# Patient Record
Sex: Female | Born: 1950
Health system: Southern US, Community
[De-identification: ages and names within clinical notes are randomized; demographics above are authoritative.]

## PROBLEM LIST (undated history)

## (undated) DIAGNOSIS — G4730 Sleep apnea, unspecified: Secondary | ICD-10-CM

## (undated) DIAGNOSIS — I10 Essential (primary) hypertension: Secondary | ICD-10-CM

## (undated) HISTORY — PX: KNEE SURGERY: SHX244

---

## 2001-04-02 ENCOUNTER — Ambulatory Visit (HOSPITAL_COMMUNITY): Admission: RE | Admit: 2001-04-02 | Discharge: 2001-04-02 | Payer: Self-pay | Admitting: *Deleted

## 2001-04-02 ENCOUNTER — Encounter: Payer: Self-pay | Admitting: Family Medicine

## 2006-04-19 ENCOUNTER — Encounter: Payer: Self-pay | Admitting: Family Medicine

## 2007-12-29 ENCOUNTER — Ambulatory Visit (HOSPITAL_COMMUNITY): Admission: RE | Admit: 2007-12-29 | Discharge: 2007-12-29 | Payer: Self-pay | Admitting: Family Medicine

## 2009-01-03 ENCOUNTER — Ambulatory Visit (HOSPITAL_COMMUNITY): Admission: RE | Admit: 2009-01-03 | Discharge: 2009-01-03 | Payer: Self-pay | Admitting: *Deleted

## 2009-02-22 ENCOUNTER — Encounter: Payer: Self-pay | Admitting: Family Medicine

## 2009-02-22 LAB — CONVERTED CEMR LAB
BUN: 20 mg/dL
CO2: 28 meq/L
Calcium: 9.9 mg/dL
Chloride: 106 meq/L
Creatinine, Ser: 1.5 mg/dL
Glucose, Bld: 113 mg/dL
Potassium: 4.3 meq/L
Sodium: 139 meq/L

## 2009-09-15 ENCOUNTER — Encounter: Payer: Self-pay | Admitting: Family Medicine

## 2009-09-15 ENCOUNTER — Ambulatory Visit: Payer: Self-pay | Admitting: Family Medicine

## 2009-09-15 DIAGNOSIS — M76899 Other specified enthesopathies of unspecified lower limb, excluding foot: Secondary | ICD-10-CM | POA: Insufficient documentation

## 2009-09-15 DIAGNOSIS — IMO0002 Reserved for concepts with insufficient information to code with codable children: Secondary | ICD-10-CM | POA: Insufficient documentation

## 2009-09-15 DIAGNOSIS — I10 Essential (primary) hypertension: Secondary | ICD-10-CM | POA: Insufficient documentation

## 2009-09-15 DIAGNOSIS — M1710 Unilateral primary osteoarthritis, unspecified knee: Secondary | ICD-10-CM

## 2009-09-15 DIAGNOSIS — E669 Obesity, unspecified: Secondary | ICD-10-CM | POA: Insufficient documentation

## 2009-09-16 ENCOUNTER — Telehealth: Payer: Self-pay | Admitting: Family Medicine

## 2009-09-16 LAB — CONVERTED CEMR LAB
BUN: 30 mg/dL — ABNORMAL HIGH (ref 6–23)
CO2: 21 meq/L (ref 19–32)
Calcium: 9.7 mg/dL (ref 8.4–10.5)
Chloride: 104 meq/L (ref 96–112)
Creatinine, Ser: 1.78 mg/dL — ABNORMAL HIGH (ref 0.40–1.20)
Glucose, Bld: 105 mg/dL — ABNORMAL HIGH (ref 70–99)
Potassium: 3.7 meq/L (ref 3.5–5.3)
Sodium: 138 meq/L (ref 135–145)

## 2009-09-20 ENCOUNTER — Telehealth: Payer: Self-pay | Admitting: Family Medicine

## 2009-09-22 ENCOUNTER — Encounter: Payer: Self-pay | Admitting: Family Medicine

## 2009-09-22 ENCOUNTER — Telehealth: Payer: Self-pay | Admitting: Family Medicine

## 2009-09-22 LAB — CONVERTED CEMR LAB
HCT: 36 %
MCV: 82.6 fL
WBC: 8.5 10*3/uL

## 2009-09-23 ENCOUNTER — Telehealth: Payer: Self-pay | Admitting: Family Medicine

## 2009-09-26 ENCOUNTER — Encounter: Payer: Self-pay | Admitting: Family Medicine

## 2009-09-28 ENCOUNTER — Ambulatory Visit: Payer: Self-pay | Admitting: Family Medicine

## 2009-09-28 DIAGNOSIS — N183 Chronic kidney disease, stage 3 unspecified: Secondary | ICD-10-CM | POA: Insufficient documentation

## 2009-09-28 DIAGNOSIS — R1084 Generalized abdominal pain: Secondary | ICD-10-CM | POA: Insufficient documentation

## 2009-09-29 ENCOUNTER — Encounter: Payer: Self-pay | Admitting: Family Medicine

## 2009-09-30 ENCOUNTER — Telehealth: Payer: Self-pay | Admitting: Family Medicine

## 2009-10-02 LAB — CONVERTED CEMR LAB
ALT: 11 units/L (ref 0–35)
AST: 16 units/L (ref 0–37)
Albumin: 4 g/dL (ref 3.5–5.2)
Alkaline Phosphatase: 62 units/L (ref 39–117)
BUN: 12 mg/dL (ref 6–23)
CO2: 24 meq/L (ref 19–32)
Calcium: 9 mg/dL (ref 8.4–10.5)
Chloride: 107 meq/L (ref 96–112)
Creatinine, Ser: 1.23 mg/dL — ABNORMAL HIGH (ref 0.40–1.20)
Glucose, Bld: 91 mg/dL (ref 70–99)
Potassium: 3.6 meq/L (ref 3.5–5.3)
Sodium: 142 meq/L (ref 135–145)
Total Bilirubin: 0.8 mg/dL (ref 0.3–1.2)
Total Protein: 7.3 g/dL (ref 6.0–8.3)

## 2009-10-13 ENCOUNTER — Encounter: Payer: Self-pay | Admitting: Family Medicine

## 2009-12-14 ENCOUNTER — Encounter: Payer: Self-pay | Admitting: Family Medicine

## 2010-01-03 ENCOUNTER — Encounter: Payer: Self-pay | Admitting: Family Medicine

## 2010-01-04 ENCOUNTER — Encounter: Payer: Self-pay | Admitting: Family Medicine

## 2010-01-05 ENCOUNTER — Ambulatory Visit (HOSPITAL_COMMUNITY): Admission: RE | Admit: 2010-01-05 | Discharge: 2010-01-05 | Payer: Self-pay | Admitting: Family Medicine

## 2010-04-04 NOTE — Progress Notes (Signed)
  Phone Note Call from Patient   Caller: Patient Details for Reason: Abdominal Swelling Summary of Call: Pt called, has not felt well since taking one dose of Ultram last week, feels uneasy and not like herself. Yesterday began having abdominal discomoft and swelling, in abdomen has increased, unable to button pants. Pt advised to go to ER to be evaluated, no fever, N/V. But per pt very large abdomen looks like she is 6 months pregnant. Will be seen at ED in [facility]

## 2010-04-04 NOTE — Assessment & Plan Note (Signed)
Summary: f/u visit/bmc   Vital Signs:  Patient profile:   60 year old female Height:      64.75 inches Weight:      245 pounds BMI:     41.23 Temp:     97.6 degrees F oral Pulse rate:   64 / minute BP sitting:   154 / 90  (left arm) Cuff size:   large  Vitals Entered By: Tessie Fass CMA (September 28, 2009 10:59 AM) CC: F/U labs-HTN/stomach issues Is Patient Diabetic? No Pain Assessment Patient in pain? no        Primary Care Provider:  Milinda Antis MD  CC:  F/U labs-HTN/stomach issues.  History of Present Illness:    Pt new to practice see previous notes  HTN/Labs- Cr 1.7 on labs, stopped all meds except for Coreg which was increased to two times a day dosing, check labs today and plan to restart some of the other meds . BP has been elevated 150-170's  Stomach issues- Pt called the after hours last week- I happend to be MD on call, Pt took 1 ultram tablet then began feeling nauseas and bloating and fatigued. Overnight she had abdominal swelling therefore went to [facility] as advised. States labs and scan were normal. Since then she continue to have abdominal bloating on and off, unsure if any spefici foods are causing it except salad. No current pain but feels sore all over, BM irregular and small but no straining or blood. Nauesea has improved but still feels run down   Habits & Providers  Alcohol-Tobacco-Diet     Tobacco Status: never  Current Medications (verified): 1)  Coreg 25 Mg Tabs (Carvedilol) .Marland Kitchen.. 1 By Mouth Bid 2)  Diovan Hct 160-12.5 Mg Tabs (Valsartan-Hydrochlorothiazide) .Marland Kitchen.. 1 By Mouth Daily 3)  Pravachol 20 Mg Tabs (Pravastatin Sodium) .Marland Kitchen.. 1 By Mouth At Bedtime  Allergies (verified): 1)  ! Codeine Sulfate (Codeine Sulfate) 2)  ! Norvasc (Amlodipine Besylate) 3)  ! Lipitor (Atorvastatin Calcium) 4)  Ultram (Tramadol Hcl)  Review of Systems       The patient complains of abdominal pain.  The patient denies fever, weight loss, and severe  indigestion/heartburn.         per hpi  denies dysuria or vaginal discharge  Physical Exam  General:  pleasant, NAD, Vital signs noted obese Mouth:  mmm Abdomen:  soft, normal bowel sounds, no distention, no guarding, no rigidity, no rebound tenderness, and no abdominal hernia.  mild TTP on deep palpation, neg Murphy's sign   Impression & Recommendations:  Problem # 1:  ESSENTIAL HYPERTENSION (ICD-401.9) Assessment Deteriorated Will recheck labs, plan to restart the DIOVAN HCT, pt likley needs multiple agents based on history Lipids at next visit- hopefully pt will have her insurance Her updated medication list for this problem includes:    Coreg 25 Mg Tabs (Carvedilol) .Marland Kitchen... 1 by mouth bid    Diovan Hct 160-12.5 Mg Tabs (Valsartan-hydrochlorothiazide) .Marland Kitchen... 1 by mouth daily  Orders: [facility] ([address]454) [facility]- Est  Level 4 (09811)  Problem # 2:  RENAL INSUFFICIENCY, ACUTE (ICD-585.9) Assessment: New  Baseline approx 1.5 based on a single lab in pt records from previous MD, check labs today  Orders: [facility]- Est  Level 4 (91478)  Problem # 3:  ABDOMINAL PAIN, GENERALIZED (ICD-789.07) Assessment: New  unclear cause, pt seen in ED per report CT done and labs, will send for records. Exam reassuring today. Symptoms mostly of gas and bloating discomfort ? lactose intolerance  vs constipation,. Pt to record foods causing upset stomach, she ate an enture meal last night and was asking about french fries.  No other work-up at this time. CMET per above,, if persist based on review of previous imaging, +/- RUQ ultrasound to r/o gallbladder etiology  Orders: [facility]- Est  Level 4 ([address]295)  Complete Medication List: 1)  Coreg 25 Mg Tabs (Carvedilol) .Marland Kitchen.. 1 by mouth bid 2)  Diovan Hct 160-12.5 Mg Tabs (Valsartan-hydrochlorothiazide) .Marland Kitchen.. 1 by mouth daily 3)  Pravachol 20 Mg Tabs (Pravastatin sodium) .Marland Kitchen.. 1 by mouth at bedtime  Patient Instructions: 1)  For your stomach, Avoid dairy  foods, fried foods,  2)  Try to note any foods- that cause any symptoms 3)  For your blood pressure,  continue the Coreg 4)  I will draw your labs today and call you tomorow 5)  I will obtain your medical records from [facility] 6)  You can take Tylenol for any aches and pains 7)  Follow-up in 4 weeks    Prevention & Chronic Care Immunizations   Influenza vaccine: Not documented    Tetanus booster: Not documented    Pneumococcal vaccine: Not documented  Colorectal Screening   Hemoccult: Not documented    Colonoscopy: Not documented  Other Screening   Pap smear: Not documented    Mammogram: Not documented   Smoking status: never  (09/28/2009)  Lipids   Total Cholesterol: Not documented   LDL: Not documented   LDL Direct: Not documented   HDL: Not documented   Triglycerides: Not documented  Hypertension   Last Blood Pressure: 154 / 90  (09/28/2009)   Serum creatinine: 1.78  (09/15/2009)   Serum potassium 3.7  (09/15/2009) CMP ordered     Hypertension flowsheet reviewed?: Yes   Progress toward BP goal: Deteriorated  Self-Management Support :   Personal Goals (by the next clinic visit) :      Personal blood pressure goal: 140/90  (09/15/2009)   Hypertension self-management support: Not documented

## 2010-04-04 NOTE — Miscellaneous (Signed)
  Clinical Lists Changes  Problems: Changed problem from RENAL INSUFFICIENCY, ACUTE (ICD-585.9) to RENAL DISEASE, CHRONIC, STAGE III (ICD-585.3) Observations: Added new observation of PAST MED HX: Hypertension Baseline Cr 1.5 - Stage III CKD hyperlipidemia No history MI/CAD/CVA or renal disease OSA GERD LVH -- EKG 2008-2009 Menopause Arthritis History of torn ligament in Left Knee Osteoarthritis bilateral knees/ ?hips  Sleep Apnea- CPAP at bedtime  G3P3 Eye doctor- Dr. Reece [facility]- 04/19/06-EF 60%, mild LVH  h/o singer's nodes h/o largngitis with ulceration of right anterior true vocal cord (12/14/2009 23:27)      Past Medical History:    Hypertension    Baseline Cr 1.5 - Stage III CKD    hyperlipidemia    No history MI/CAD/CVA or renal disease    OSA    GERD    LVH -- EKG 2008-2009    Menopause    Arthritis    History of torn ligament in Left Knee    Osteoarthritis bilateral knees/ ?hips     Sleep Apnea- CPAP at bedtime     G3P3    Eye doctor- Dr. Re[facility]    echo- 04/19/06-EF 60%, mild LVH     h/o singer's nodes    h/o largngitis with ulceration of right anterior true vocal cord

## 2010-04-04 NOTE — Miscellaneous (Signed)
Summary: [facility] records- CT scan and lab results--  Clinical Lists Changes  Observations: Added new observation of PLATELETK/UL: 291 K/uL (09/22/2009 3:40) Added new observation of RDW: 13.6 % (09/22/2009 3:40) Added new observation of MCHC RBC: 26.1 g/dL (16/12/9602 5:40) Added new observation of MCV: 82.6 fL (09/22/2009 3:40) Added new observation of HCT: 36.0 % (09/22/2009 3:40) Added new observation of HGB: 11.4 g/dL (98/01/9146 8:29) Added new observation of WBC COUNT: 8.5 10*3/microliter (09/22/2009 3:40)      Reviewed [facility] ER notes Normal labs besides slightly low potassium CT scan for abdominal distension- No acute abdominal or pelvic process, slight increase in colonic wall thickening thought to be secondary to exaggerated decompressed state, noted small 5mm non specific RLQ lymph nodes, uterus enlarged with isodense 4cm mass likley fibroid CXR- small bilat effusiions, no effusions no infiltrate    f/u abd pain in 4 weeks at next visit, if still present- consider GU exam and ultrasound to f/u fibroids

## 2010-04-04 NOTE — Progress Notes (Signed)
  Phone Note Outgoing Call   Call placed by: Milinda Antis MD,  September 23, 2009 6:29 PM Call placed to: Patient Details for Reason: f/u ER visit Summary of Call:  Pt went to the [facility] for abdominal discomfort and swelling. CT scan and labs done, given IVF, UA everything was normal. Blood pressure was very elevated at ER- which was expected. Currently feeling better and now with minimal swelling. Will follow-up on Wed for repeat BMET and additional blood pressure meds

## 2010-04-04 NOTE — Assessment & Plan Note (Signed)
Summary: NP,tcb   Vital Signs:  Patient profile:   60 year old female Height:      64.75 inches Weight:      242 pounds BMI:     40.73 Temp:     97.8 degrees F oral Pulse rate:   70 / minute BP sitting:   125 / 88  (right arm) Cuff size:   large  Vitals Entered By: Tessie Fass CMA (September 15, 2009 9:51 AM) CC: NEW PT Is Patient Diabetic? No   CC:  NEW PT.  History of Present Illness:   60 y.o. female, complains today of bilat hip pain, Knee pain, HTN  Hip pain- for past 8 months, getting worse, described as sharp pain and aching pain on sides of hips, worse at night, pain with putting pressure on either side, no pain with ambulation or day to day activities  HTN- taking Triam/HCTZ 75/50, Diovan HCT 180/12.5, Coreg 25mg  daily (noted twice daily dosing on bottle), feels run down, does not take BP at home, unsure if she has had any problems with potassium in past  Knee pain- history of ligament injury after falling in [place] 15 years ago, seen by an orthopedist in past 5 years for severe pain, unsure of which doctor she saw or where, told her she would neeed a knee replacement. Aleve helps knees but not hip   Prevention- UTD on Mammogram                    Last PAP 2007- Dr. Victoriano Lain                    Colonoscopy 6 years ago, pt can not recall physician or where    Habits & Providers  Alcohol-Tobacco-Diet     Tobacco Status: never  Current Medications (verified): 1)  Coreg 25 Mg Tabs (Carvedilol) .Marland Kitchen.. 1 By Mouth Bid 2)  Diovan Hct 160-12.5 Mg Tabs (Valsartan-Hydrochlorothiazide) .Marland Kitchen.. 1 By Mouth Daily 3)  Pravachol 20 Mg Tabs (Pravastatin Sodium) .Marland Kitchen.. 1 By Mouth At Bedtime 4)  Ultram 50 Mg Tabs (Tramadol Hcl) .Marland Kitchen.. 1 By Mouth Three Times A Day As Needed Pain  Allergies (verified): 1)  ! Codeine Sulfate (Codeine Sulfate)  Past History:  Family History: Last updated: 09/15/2009 Mother- Breast Cancer, RA, heart disease, HTN-- deceased  Father- HTN--  deceased Siblings-Healthy   Social History: Last updated: 09/15/2009 Has 3 children, Married  No tobacco, no illicit, no alcohol   Past Medical History: Hypertension  hyperlipidemia No history MI/CAD/CVA or renal disease OSA Arthritis History of torn ligament in Left Knee Osteoarthritis bilateral knees/ ?hips  Sleep Apnea- CPAP at bedtime  G3P3 Eye doctor- Dr. [facility]  Past Surgical History: BTL- 1981 Knee Arthroscopy >10 years ago  Family History: Mother- Breast Cancer, RA, heart disease, HTN-- deceased  Father- HTN-- deceased Siblings-Healthy   Social History: Has 3 children, Married  No tobacco, no illicit, no alcohol Smoking Status:  never  Physical Exam  General:  pleasant, NAD, Vital signs noted obese Eyes:  PERRL, EOMI, small pupils- unable to visualize fundus, arcus senilis bilat Ears:  TM clear bilat Nose:  no external deformity and no nasal discharge.   Mouth:  upper and lower dentures Neck:  supple, no thryomegaly no carotid bruit Lungs:  CTAB Heart:  RRR, no murmur Abdomen:  soft, non-tender, no distention, and no masses.   Msk:  Hip: ROM- normal IR: 5/5, ER: 5/5, Flexion: 5/5, Extension: 5/5, Abduction: 5/5, Adduction:  5/5 Pelvic alignment unremarkable to inspection and palpation. Standing hip rotation normal TTP over Greater trochanter  No tenderness over piriformis and greater trochanter. No SI joint tenderness  Knees- no effusion, ligaments in tact, no erythema, mild crepitus  Psych:  Oriented X3, normally interactive, good eye contact, and not depressed appearing.     Impression & Recommendations:  Problem # 1:  ESSENTIAL HYPERTENSION (ICD-401.9) Assessment New  Will d/c Triam/HCTZ, pt over allowed dose for HCTZ. Check BMET with so many potassium sparing drugs. Increase coreg to two times a day dosing. RTC in 2 weeks for BP check Her updated medication list for this problem includes:    Coreg 25 Mg Tabs (Carvedilol)  .Marland Kitchen... 1 by mouth bid    Diovan Hct 160-12.5 Mg Tabs (Valsartan-hydrochlorothiazide) .Marland Kitchen... 1 by mouth daily  Orders: [facility] ([address])  Problem # 2:  TROCHANTERIC BURSITIS, BILATERAL (ICD-726.5) Assessment: New [place] bursitis based on history. Pt to establish insurance, in meantime NSAID and Ultram, pt to think about injections into hip for relief  Problem # 3:  OSTEOARTHRITIS, KNEE (ICD-715.96) Assessment: New  Her updated medication list for this problem includes:    Ultram 50 Mg Tabs (Tramadol hcl) .Marland Kitchen... 1 by mouth three times a day as needed pain  Problem # 4:  EXOGENOUS OBESITY (ICD-278.00) Assessment: New  Complete Medication List: 1)  Coreg 25 Mg Tabs (Carvedilol) .Marland Kitchen.. 1 by mouth bid 2)  Diovan Hct 160-12.5 Mg Tabs (Valsartan-hydrochlorothiazide) .Marland Kitchen.. 1 by mouth daily 3)  Pravachol 20 Mg Tabs (Pravastatin sodium) .Marland Kitchen.. 1 by mouth at bedtime 4)  Ultram 50 Mg Tabs (Tramadol hcl) .Marland Kitchen.. 1 by mouth three times a day as needed pain  Patient Instructions: 1)  Stop taking the Triamterene HCTZ 2)  Take the carvediolol twice a day 3)  Return in 2 weeks for a blood pressure visit 4)  I will check your potassium today 5)  Start the ultram as needed for pain  Prescriptions: ULTRAM 50 MG TABS (TRAMADOL HCL) 1 by mouth three times a day as needed pain  #90 x 1   Entered and Authorized by:   Milinda Antis MD   Signed by:   Milinda Antis MD on 09/15/2009   Method used:   Electronically to        [facility] [phone]* (retail)       [address]       [place], [place]  [address]       Ph: [phone]       Fax: [phone]   RxID:   [id number] PRAVACHOL 20 MG TABS (PRAVASTATIN SODIUM) 1 by mouth at bedtime  #30 x 0   Entered and Authorized by:   Milinda Antis MD   Signed by:   Milinda Antis MD on 09/15/2009   Method used:   Historical   RxID:   [id number]    Prevention & Chronic Care Immunizations   Influenza vaccine: Not documented    Tetanus  booster: Not documented    Pneumococcal vaccine: Not documented  Colorectal Screening   Hemoccult: Not documented    Colonoscopy: Not documented  Other Screening   Pap smear: Not documented    Mammogram: Not documented   Smoking status: never  (09/15/2009)  Lipids   Total Cholesterol: Not documented   LDL: Not documented   LDL Direct: Not documented   HDL: Not documented   Triglycerides: Not documented  Hypertension   Last Blood Pressure: 125 / 88  (09/15/2009)  Serum creatinine: Not documented   Serum potassium Not documented    Hypertension flowsheet reviewed?: Yes   Progress toward BP goal: At goal  Self-Management Support :   Personal Goals (by the next clinic visit) :      Personal blood pressure goal: 140/90  (09/15/2009)   Hypertension self-management support: Not documented  Obtain records from prior PCP

## 2010-04-04 NOTE — Progress Notes (Signed)
Summary: Renal Failure- needs appt  Phone Note Outgoing Call   Call placed by: Milinda Antis MD,  September 16, 2009 10:56 AM Details for Reason: Lab results Summary of Call: Discussed renal failure unknown if acute vs chronic no medical records yet, sent request yesterday. Pt to stop diovan and continue coreg only, follow-up with me  the week of the 25th. Pt voiced understandings, no NSAIDS

## 2010-04-04 NOTE — Progress Notes (Signed)
Summary: phn msg  Phone Note Call from Patient Call back at [facility] Phone [phone]   Caller: Patient Summary of Call: The pain med that Dr. Jeanice Lim perscribed her made her sick and she is unable to take it. Initial call taken by: Clydell Hakim,  September 20, 2009 2:39 PM  Follow-up for Phone Call        Ask pt to take with food, if not she can take Tylenol, but no NSAIDS such as aleve, advil, ibuprofen, BC powder because of her kidneys. Follow-up by: Milinda Antis MD,  September 20, 2009 3:28 PM     Appended Document: phn msg Pt informed.

## 2010-04-04 NOTE — Miscellaneous (Signed)
  Clinical Lists Changes  Observations: Added new observation of MAMMOGRAM: Normal, BiRads 1 (01/03/2009 15:34)

## 2010-04-04 NOTE — Progress Notes (Signed)
Summary: Lab Res  Phone Note Call from Patient Call back at [facility] Phone [phone]   Caller: Patient Summary of Call: Pt checking on results of blood work. Initial call taken by: Clydell Hakim,  September 30, 2009 9:55 AM  Follow-up for Phone Call        will forward to MD. Follow-up by: Theresia Lo RN,  September 30, 2009 1:29 PM  Additional Follow-up for Phone Call Additional follow up Details #1::        Pt can restart the Diovan, Her kidney function improved to 1.23 and her liver and gallbladder labs were normal. I want to see her again in 4 weeks to follow-up her blood pressure  Additional Follow-up by: Milinda Antis MD,  October 02, 2009 2:16 PM     Appended Document: Lab Res message left to call back. Theresia Lo RN  October 03, 2009 10:56 AM   patient notified. Theresia Lo RN  October 03, 2009 1:50 PM

## 2010-04-04 NOTE — Miscellaneous (Signed)
Summary: Records review  Clinical Lists Changes  Medications: Removed medication of ULTRAM 50 MG TABS (TRAMADOL HCL) 1 by mouth three times a day as needed pain Allergies: Added new allergy or adverse reaction of NORVASC (AMLODIPINE BESYLATE) Added new allergy or adverse reaction of LIPITOR (ATORVASTATIN CALCIUM) Observations: Added new observation of PAST SURG HX: BTL- 1981 Knee Arthroscopy >10 years ago  (09/26/2009 15:39) Added new observation of PAST MED HX: Hypertension Baseline Cr 1.5  hyperlipidemia No history MI/CAD/CVA or renal disease OSA GERD LVH -- EKG 2008-2009 Menopause Arthritis History of torn ligament in Left Knee Osteoarthritis bilateral knees/ ?hips  Sleep Apnea- CPAP at bedtime  G3P3 Eye doctor- Dr. Re[facility] echo- 04/19/06-EF 60%, mild LVH  h/o singer's nodes h/o largngitis with ulceration of right anterior true vocal cord (09/26/2009 15:39) Added new observation of CALCIUM: 9.9 mg/dL (63/87/5643 32:95) Added new observation of CREATININE: 1.5 mg/dL (18/84/1660 63:01) Added new observation of BUN: 20 mg/dL (60/12/9321 55:73) Added new observation of BG RANDOM: 113 mg/dL (22/04/5425 06:23) Added new observation of CO2 PLSM/SER: 28 meq/L (02/22/2009 15:39) Added new observation of CL SERUM: 106 meq/L (02/22/2009 15:39) Added new observation of K SERUM: 4.3 meq/L (02/22/2009 15:39) Added new observation of NA: 139 meq/L (02/22/2009 15:39)      Past Medical History:    Hypertension    Baseline Cr 1.5     hyperlipidemia    No history MI/CAD/CVA or renal disease    OSA    GERD    LVH -- EKG 2008-2009    Menopause    Arthritis    History of torn ligament in Left Knee    Osteoarthritis bilateral knees/ ?hips     Sleep Apnea- CPAP at bedtime     G3P3    Eye doctor- Dr. [facility]    echo- 04/19/06-EF 60%, mild LVH     h/o singer's nodes    h/o largngitis with ulceration of right anterior true vocal cord  Past Surgical  History:    BTL- 1981    Knee Arthroscopy >10 years ago

## 2010-04-04 NOTE — Letter (Signed)
Summary: Transthoracic Echocardiogram  Transthoracic Echocardiogram   Imported By: Clydell Hakim 09/30/2009 14:36:07  _____________________________________________________________________  External Attachment:    Type:   Image     Comment:   External Document

## 2010-04-04 NOTE — Miscellaneous (Signed)
Summary: ROI  ROI   Imported By: Knox Royalty 09/24/2009 13:09:22  _____________________________________________________________________  External Attachment:    Type:   Image     Comment:   External Document

## 2010-04-05 ENCOUNTER — Encounter: Payer: Self-pay | Admitting: *Deleted

## 2010-11-27 ENCOUNTER — Other Ambulatory Visit (HOSPITAL_COMMUNITY): Payer: Self-pay | Admitting: Family Medicine

## 2010-12-22 ENCOUNTER — Other Ambulatory Visit (HOSPITAL_COMMUNITY): Payer: Self-pay | Admitting: Family Medicine

## 2010-12-22 DIAGNOSIS — Z1231 Encounter for screening mammogram for malignant neoplasm of breast: Secondary | ICD-10-CM

## 2011-01-11 ENCOUNTER — Ambulatory Visit (HOSPITAL_COMMUNITY)
Admission: RE | Admit: 2011-01-11 | Discharge: 2011-01-11 | Disposition: A | Discharge status: Home / Self Care | Payer: Self-pay | Source: Ambulatory Visit | Attending: Family Medicine | Admitting: Family Medicine

## 2011-01-11 DIAGNOSIS — Z1231 Encounter for screening mammogram for malignant neoplasm of breast: Secondary | ICD-10-CM

## 2011-01-22 ENCOUNTER — Other Ambulatory Visit: Payer: Self-pay | Admitting: Family Medicine

## 2011-01-22 DIAGNOSIS — R928 Other abnormal and inconclusive findings on diagnostic imaging of breast: Secondary | ICD-10-CM

## 2011-02-06 ENCOUNTER — Ambulatory Visit
Admission: RE | Admit: 2011-02-06 | Discharge: 2011-02-06 | Disposition: A | Discharge status: Home / Self Care | Payer: No Typology Code available for payment source | Source: Ambulatory Visit | Attending: Family Medicine | Admitting: Family Medicine

## 2011-02-06 DIAGNOSIS — R928 Other abnormal and inconclusive findings on diagnostic imaging of breast: Secondary | ICD-10-CM

## 2012-02-13 ENCOUNTER — Other Ambulatory Visit (HOSPITAL_COMMUNITY): Payer: Self-pay | Admitting: Family Medicine

## 2012-02-13 DIAGNOSIS — Z1231 Encounter for screening mammogram for malignant neoplasm of breast: Secondary | ICD-10-CM

## 2012-02-20 ENCOUNTER — Ambulatory Visit (HOSPITAL_COMMUNITY)
Admission: RE | Admit: 2012-02-20 | Discharge: 2012-02-20 | Disposition: A | Discharge status: Home / Self Care | Payer: Self-pay | Source: Ambulatory Visit | Attending: Family Medicine | Admitting: Family Medicine

## 2012-02-20 DIAGNOSIS — Z1231 Encounter for screening mammogram for malignant neoplasm of breast: Secondary | ICD-10-CM

## 2012-07-16 ENCOUNTER — Other Ambulatory Visit: Payer: Self-pay | Admitting: Family Medicine

## 2012-07-16 DIAGNOSIS — N63 Unspecified lump in unspecified breast: Secondary | ICD-10-CM

## 2012-07-29 ENCOUNTER — Ambulatory Visit
Admission: RE | Admit: 2012-07-29 | Discharge: 2012-07-29 | Disposition: A | Discharge status: Home / Self Care | Payer: BC Managed Care – PPO | Source: Ambulatory Visit | Attending: Family Medicine | Admitting: Family Medicine

## 2012-07-29 DIAGNOSIS — N63 Unspecified lump in unspecified breast: Secondary | ICD-10-CM

## 2014-02-11 ENCOUNTER — Other Ambulatory Visit (HOSPITAL_COMMUNITY): Payer: Self-pay | Admitting: Family Medicine

## 2015-04-27 ENCOUNTER — Encounter (HOSPITAL_BASED_OUTPATIENT_CLINIC_OR_DEPARTMENT_OTHER): Payer: Self-pay | Admitting: *Deleted

## 2015-04-27 ENCOUNTER — Emergency Department (HOSPITAL_BASED_OUTPATIENT_CLINIC_OR_DEPARTMENT_OTHER)
Admission: EM | Admit: 2015-04-27 | Discharge: 2015-04-27 | Disposition: A | Discharge status: Home / Self Care | Payer: Medicare Other | Attending: Emergency Medicine | Admitting: Emergency Medicine

## 2015-04-27 ENCOUNTER — Emergency Department (HOSPITAL_BASED_OUTPATIENT_CLINIC_OR_DEPARTMENT_OTHER): Payer: Medicare Other

## 2015-04-27 DIAGNOSIS — Z9981 Dependence on supplemental oxygen: Secondary | ICD-10-CM | POA: Insufficient documentation

## 2015-04-27 DIAGNOSIS — R42 Dizziness and giddiness: Secondary | ICD-10-CM | POA: Diagnosis not present

## 2015-04-27 DIAGNOSIS — J159 Unspecified bacterial pneumonia: Secondary | ICD-10-CM | POA: Diagnosis not present

## 2015-04-27 DIAGNOSIS — R0602 Shortness of breath: Secondary | ICD-10-CM

## 2015-04-27 DIAGNOSIS — Z79899 Other long term (current) drug therapy: Secondary | ICD-10-CM | POA: Insufficient documentation

## 2015-04-27 DIAGNOSIS — I10 Essential (primary) hypertension: Secondary | ICD-10-CM | POA: Diagnosis not present

## 2015-04-27 DIAGNOSIS — G4730 Sleep apnea, unspecified: Secondary | ICD-10-CM | POA: Diagnosis not present

## 2015-04-27 DIAGNOSIS — J189 Pneumonia, unspecified organism: Secondary | ICD-10-CM

## 2015-04-27 HISTORY — DX: Essential (primary) hypertension: I10

## 2015-04-27 HISTORY — DX: Sleep apnea, unspecified: G47.30

## 2015-04-27 LAB — CBC WITH DIFFERENTIAL/PLATELET
Basophils Absolute: 0 10*3/uL (ref 0.0–0.1)
Basophils Relative: 1 %
EOS ABS: 0.2 10*3/uL (ref 0.0–0.7)
EOS PCT: 3 %
HCT: 37.4 % (ref 36.0–46.0)
Hemoglobin: 11.9 g/dL — ABNORMAL LOW (ref 12.0–15.0)
LYMPHS ABS: 0.8 10*3/uL (ref 0.7–4.0)
Lymphocytes Relative: 18 %
MCH: 26.6 pg (ref 26.0–34.0)
MCHC: 31.8 g/dL (ref 30.0–36.0)
MCV: 83.5 fL (ref 78.0–100.0)
MONO ABS: 0.4 10*3/uL (ref 0.1–1.0)
MONOS PCT: 9 %
Neutro Abs: 3.2 10*3/uL (ref 1.7–7.7)
Neutrophils Relative %: 69 %
PLATELETS: 313 10*3/uL (ref 150–400)
RBC: 4.48 MIL/uL (ref 3.87–5.11)
RDW: 14.4 % (ref 11.5–15.5)
WBC: 4.6 10*3/uL (ref 4.0–10.5)

## 2015-04-27 LAB — BASIC METABOLIC PANEL
Anion gap: 7 (ref 5–15)
BUN: 16 mg/dL (ref 6–20)
CALCIUM: 8.6 mg/dL — AB (ref 8.9–10.3)
CO2: 27 mmol/L (ref 22–32)
CREATININE: 1.09 mg/dL — AB (ref 0.44–1.00)
Chloride: 107 mmol/L (ref 101–111)
GFR, EST NON AFRICAN AMERICAN: 53 mL/min — AB (ref >60)
GLUCOSE: 107 mg/dL — AB (ref 65–99)
Potassium: 3.6 mmol/L (ref 3.5–5.1)
Sodium: 141 mmol/L (ref 135–145)

## 2015-04-27 LAB — TROPONIN I: Troponin I: <0.03 ng/mL (ref <0.031)

## 2015-04-27 LAB — BRAIN NATRIURETIC PEPTIDE: B Natriuretic Peptide: 98.1 pg/mL (ref 0.0–100.0)

## 2015-04-27 LAB — D-DIMER, QUANTITATIVE (NOT AT ARMC): D DIMER QUANT: 0.33 ug{FEU}/mL (ref 0.00–0.50)

## 2015-04-27 MED ORDER — DOXYCYCLINE HYCLATE 100 MG PO CAPS: 100.0000 mg | ORAL_CAPSULE | Freq: Two times a day (BID) | ORAL | Status: AC

## 2015-04-27 MED FILL — DOXYCYCLINE HYC 100 MG CAP: 100 | 7 days supply | Qty: 14 | Fill #0

## 2015-04-27 NOTE — ED Notes (Signed)
Pt placed on auto vitals Q30. Patient placed on cardiac monitor.  

## 2015-04-27 NOTE — ED Provider Notes (Signed)
CSN: [id number]     Arrival date & time 04/27/15  1202 History   First MD Initiated Contact with Patient 04/27/15 1223     No chief complaint on file.    (Consider location/radiation/quality/duration/timing/severity/associated sxs/prior Treatment) HPI  65 year old female with a history of hypertension and sleep apnea presents with acute dyspnea. She was wearing her CPAP like normal and she woke up in the middle the night at 3 AM with acute shortness of breath. No chest pain. Woke up, took her mask off, and sat up in bed. Short of breath seemed improved to the point that she can go back to sleep. However was still present although much better when she woke up again this morning so she called her doctor and was referred to the ER. While driving here she felt acutely lightheaded like she might pass out. This has since resolved but she then called EMS because she is worried about driving. He states that short of breath is still present but has significant improved and is now mild. No leg swelling. No chest pain or pressure. Denies any cough or recent fever. A couple weeks ago she had a "cold" that was treated with antibiotics.  Past Medical History  Diagnosis Date  . Hypertension   . Sleep apnea    History reviewed. No pertinent past surgical history. No family history on file. Social History  Substance Use Topics  . Smoking status: Never Smoker   . Smokeless tobacco: None  . Alcohol Use: None   OB History    No data available     Review of Systems  Constitutional: Negative for fever.  Respiratory: Positive for shortness of breath. Negative for cough.   Cardiovascular: Negative for chest pain and leg swelling.  Neurological: Positive for light-headedness. Negative for syncope.  All other systems reviewed and are negative.     Allergies  Amlodipine besylate; Atorvastatin; Codeine sulfate; and Tramadol hcl  Home Medications   Prior to Admission medications   Medication Sig Start  Date End Date Taking? Authorizing Provider  amLODipine (NORVASC) 5 MG tablet Take 5 mg by mouth daily.   Yes Historical Provider, MD  carvedilol (COREG) 25 MG tablet Take 25 mg by mouth 2 (two) times daily with meals.     Yes Historical Provider, MD  hydrALAZINE (APRESOLINE) 25 MG tablet Take 25 mg by mouth 3 (three) times daily.   Yes Historical Provider, MD  Oral Electrolytes (HYDRALIFE PO) Take by mouth.   Yes Historical Provider, MD   BP 169/89 mmHg  Pulse 61  Temp(Src) 98.5 F (36.9 C) (Oral)  Resp 18  Ht 5\' 5"  (1.651 m)  Wt 245 lb (111.131 kg)  BMI 40.77 kg/m2  SpO2 100% Physical Exam  Constitutional: She is oriented to person, place, and time. She appears well-developed and well-nourished.  HENT:  Head: Normocephalic and atraumatic.  Right Ear: External ear normal.  Left Ear: External ear normal.  Nose: Nose normal.  Eyes: Right eye exhibits no discharge. Left eye exhibits no discharge.  Cardiovascular: Normal rate, regular rhythm and normal heart sounds.   Pulmonary/Chest: Effort normal and breath sounds normal.  Abdominal: Soft. There is no tenderness.  Neurological: She is alert and oriented to person, place, and time.  Skin: Skin is warm and dry.  Nursing note and vitals reviewed.   ED Course  Procedures (including critical care time) Labs Review Labs Reviewed  BASIC METABOLIC PANEL - Abnormal; Notable for the following:    Glucose, Bld 107 (*)  Creatinine, Ser 1.09 (*)    Calcium 8.6 (*)    GFR calc non Af Amer 53 (*)    All other components within normal limits  CBC WITH DIFFERENTIAL/PLATELET - Abnormal; Notable for the following:    Hemoglobin 11.9 (*)    All other components within normal limits  TROPONIN I  D-DIMER, QUANTITATIVE (NOT AT [facility])  BRAIN NATRIURETIC PEPTIDE    Imaging Review Dg Chest 2 View  04/27/2015  CLINICAL DATA:  Shortness of breath beginning last night. EXAM: CHEST  2 VIEW COMPARISON:  09/19/2012 FINDINGS: The heart is at the  upper limits of normal in size. The aorta is unfolded. There is mild patchy infiltrate in the lingula, best seen on the lateral view. No advanced consolidation or collapse. No effusions. No significant bone finding. IMPRESSION: Mild patchy infiltrate in the lingula consistent with mild pneumonia. Electronically Signed   By: Nelson Chimes M.D.   On: 04/27/2015 12:33   I have personally reviewed and evaluated these images and lab results as part of my medical decision-making.   EKG Interpretation   Date/Time:  Wednesday April 27 2015 12:59:07 EST Ventricular Rate:  59 PR Interval:  248 QRS Duration: 110 QT Interval:  475 QTC Calculation: 471 R Axis:   3 Text Interpretation:  Sinus rhythm Prolonged PR interval Probable left  ventricular hypertrophy Artifact in lead(s) V4 No old tracing to compare  Confirmed by [place]  MD, Wel[place] ([id number]) on 04/27/2015 1:29:07 PM      MDM   Final diagnoses:  Shortness of breath  Community acquired pneumonia    Patient's workup shows a mild pneumonia. She is not hypoxic and has no increased work of breathing. She was able to ambulate around the ER with no shortness of breath, and no hypoxia. Patient appears well here. I doubt coronary disease/ACS given no short of breath or chest pain with walking, and benign ECG and a negative troponin that was obtained 10 hours after symptoms started. Low risk for PE, thus a d-dimer was obtained and is negative. No further workup indicated. Recently finished azithromycin a couple weeks ago for some other respiratory infection, this will use a different antibiotic and refer back to PCP. Discussed strict return precautions.    Sherwood Gambler, MD 04/27/15 [phone]

## 2015-04-27 NOTE — ED Notes (Signed)
C/o sob since last night. Is on c-pap at night. Feels fatigued. Arrived via [facility].

## 2015-04-27 NOTE — ED Notes (Signed)
Ambulated without difficulty x 2 around nurses station. Hrt rate maintained at 74-77 and pulse ox 95%-98%. Denies complaints. Tolerated well.

## 2016-08-03 IMAGING — DX DG CHEST 2V
2 series · 2 of 2 positions shown · non-contrast
Comparison: 09/19/2012

CLINICAL DATA: Shortness of breath beginning last night.

EXAM:
CHEST  2 VIEW

[chest pa]
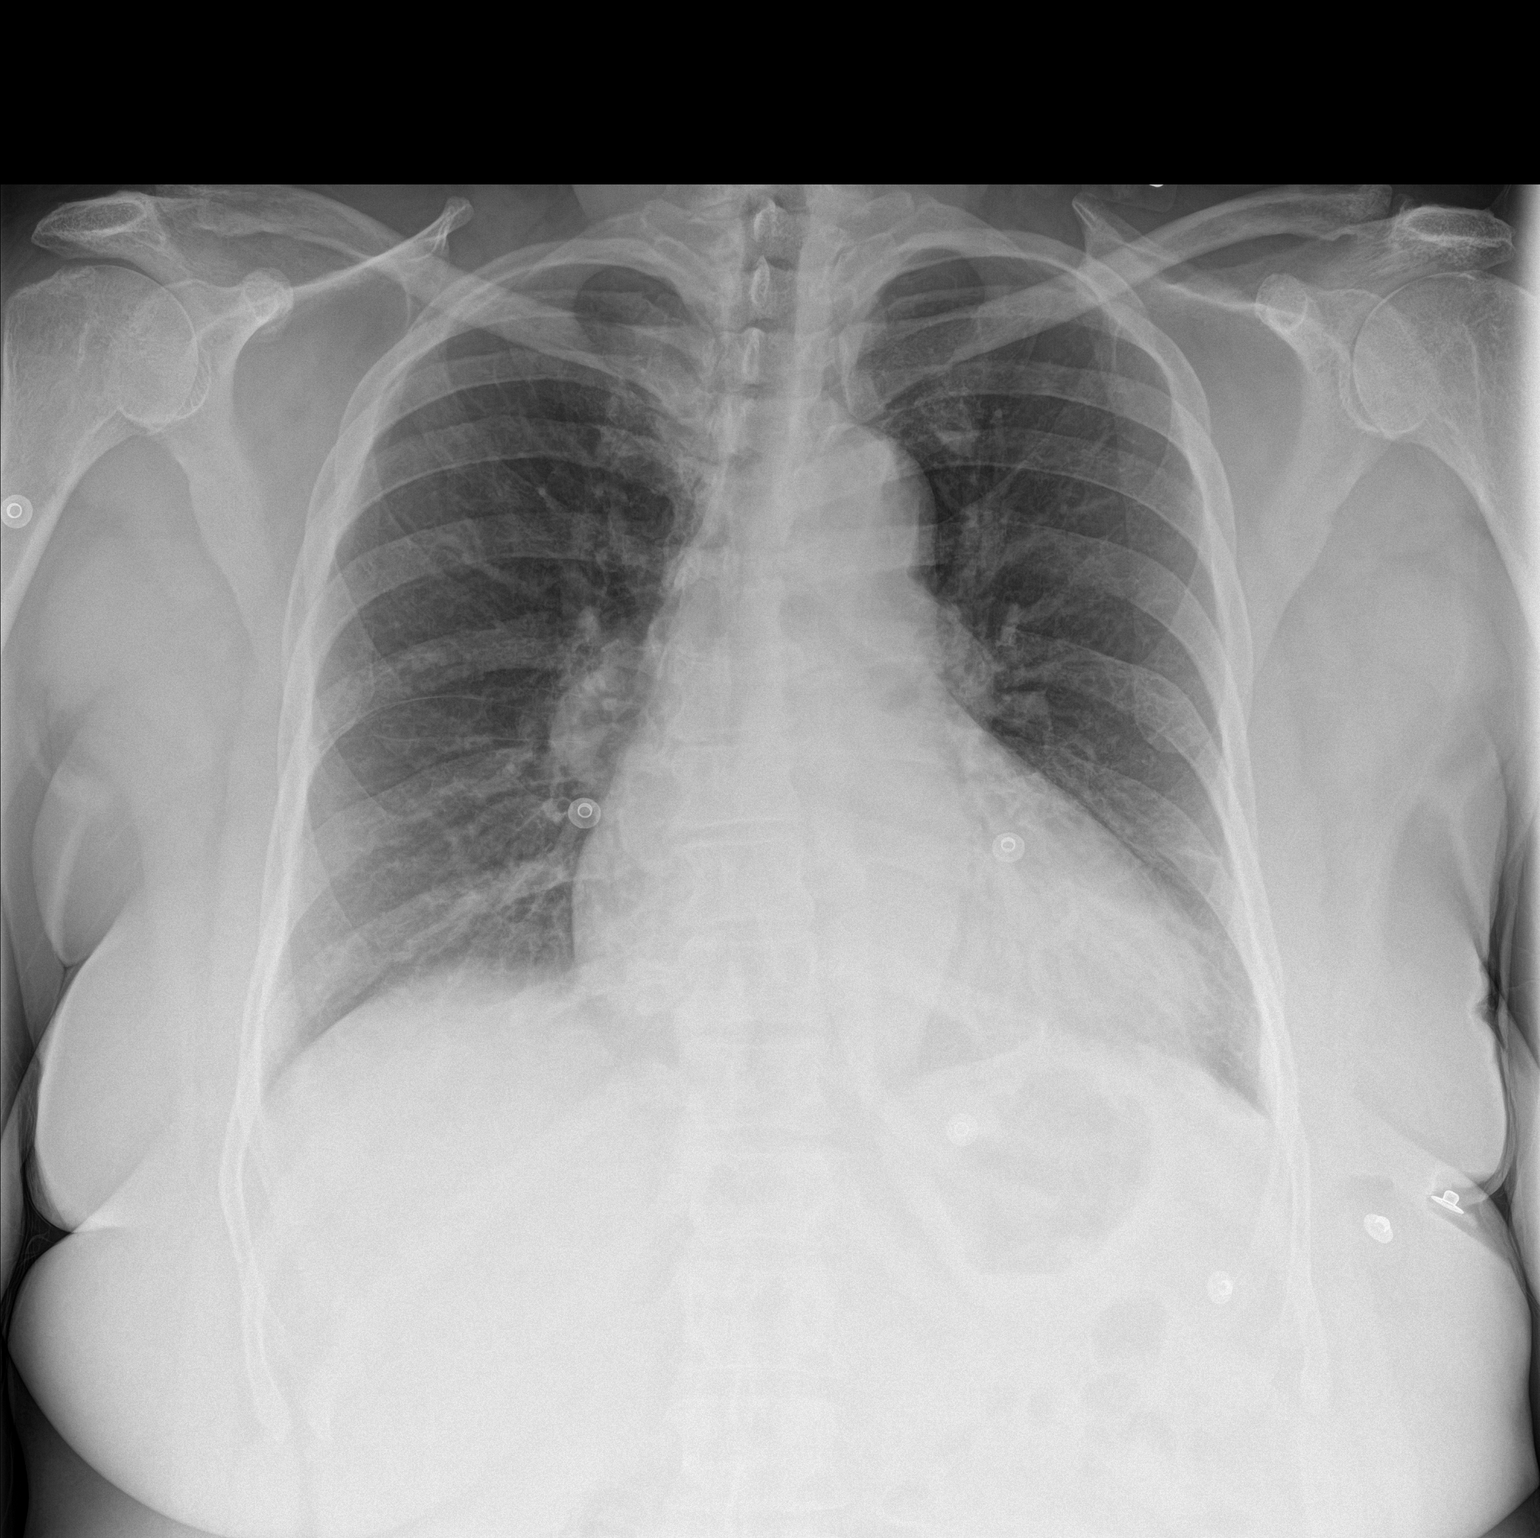

[chest lat]
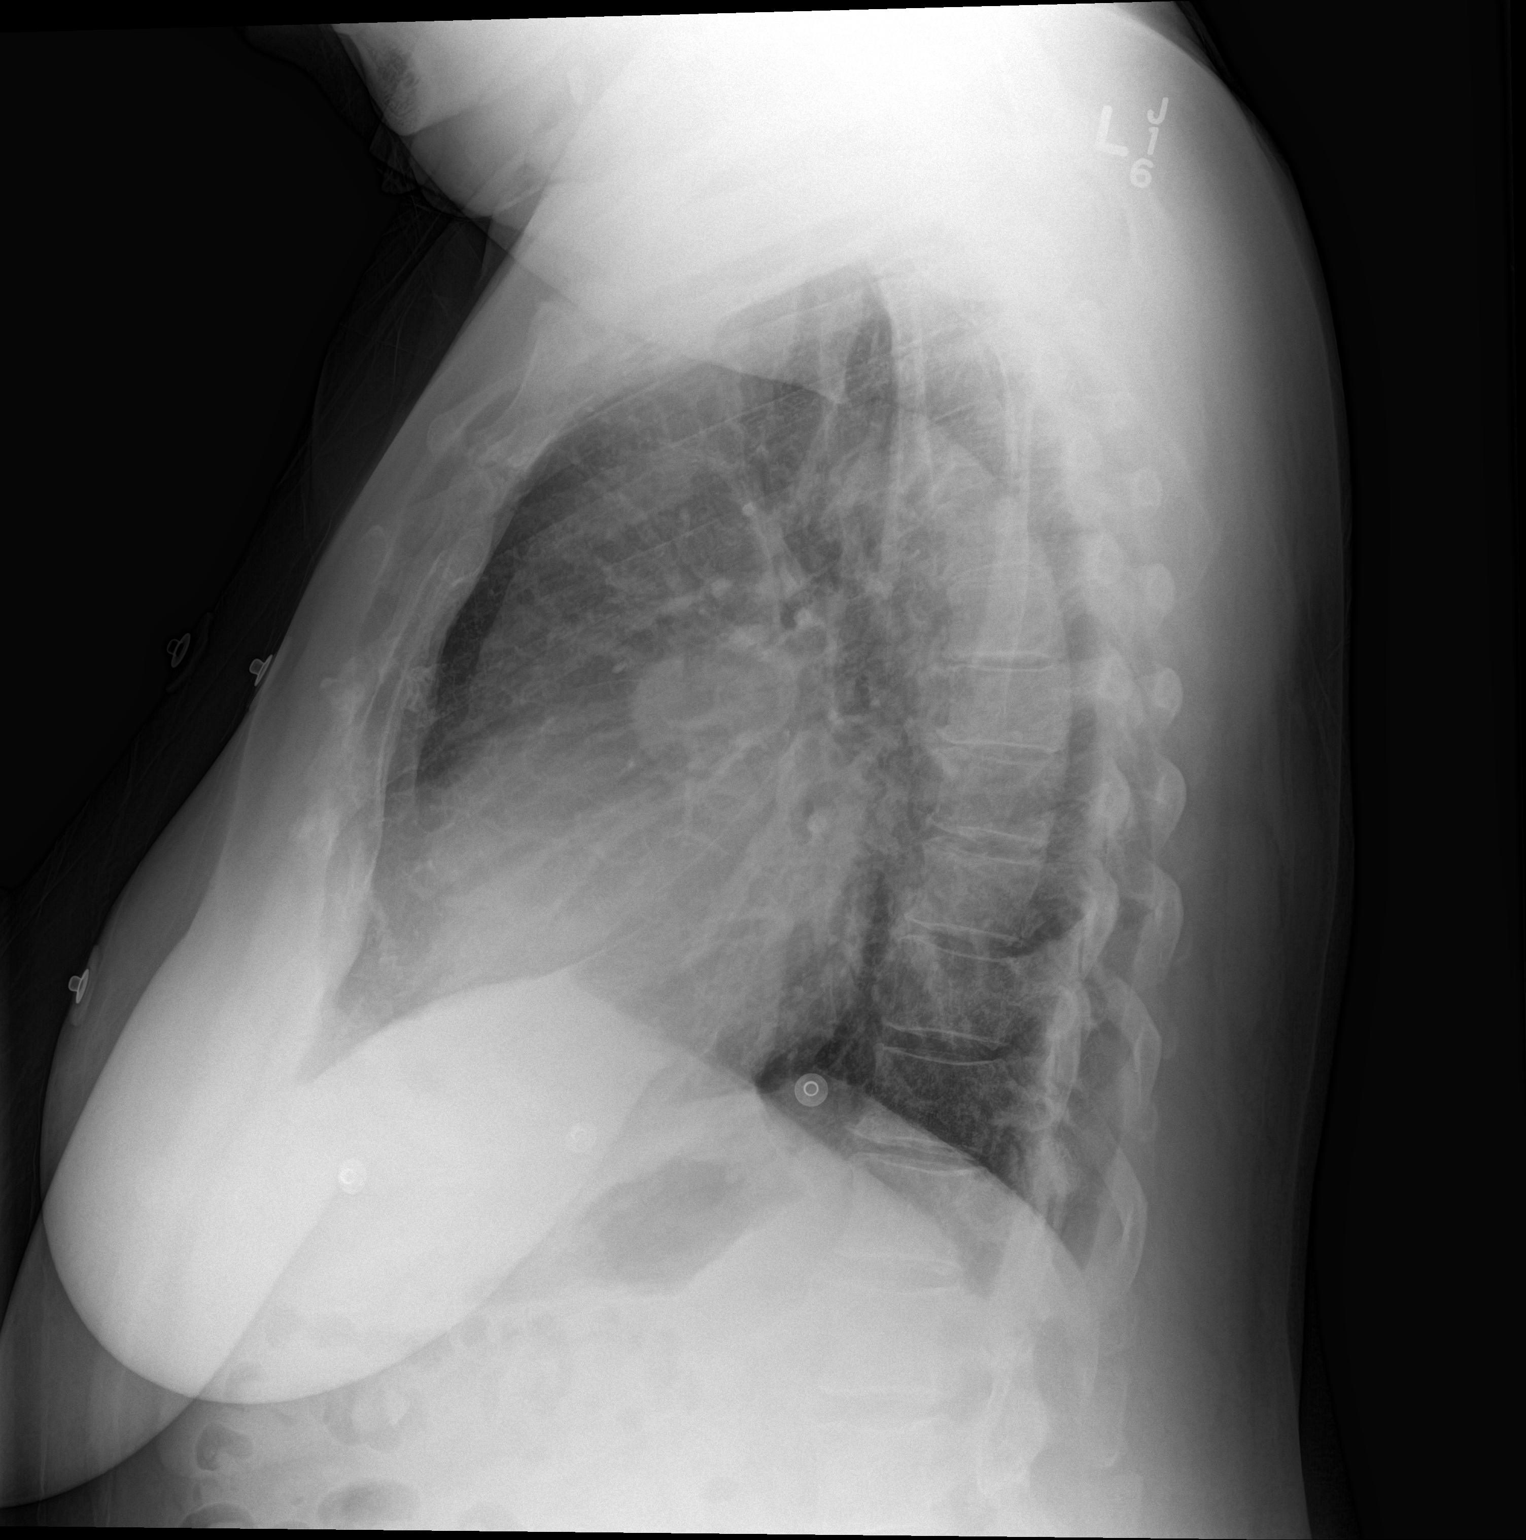

[2 of 2 positions shown; findings below may reference images not displayed]

FINDINGS: The heart is at the upper limits of normal in size. The aorta is
unfolded. There is mild patchy infiltrate in the lingula, best seen
on the lateral view. No advanced consolidation or collapse. No
effusions. No significant bone finding.
IMPRESSION: Mild patchy infiltrate in the lingula consistent with mild
pneumonia.

## 2021-02-27 ENCOUNTER — Emergency Department (HOSPITAL_BASED_OUTPATIENT_CLINIC_OR_DEPARTMENT_OTHER)
Admission: EM | Admit: 2021-02-27 | Discharge: 2021-02-27 | Disposition: A | Discharge status: Home / Self Care | Payer: Medicare Other | Attending: Emergency Medicine | Admitting: Emergency Medicine

## 2021-02-27 ENCOUNTER — Telehealth (HOSPITAL_COMMUNITY): Payer: Self-pay | Admitting: Emergency Medicine

## 2021-02-27 ENCOUNTER — Other Ambulatory Visit: Payer: Self-pay

## 2021-02-27 ENCOUNTER — Encounter (HOSPITAL_BASED_OUTPATIENT_CLINIC_OR_DEPARTMENT_OTHER): Payer: Self-pay | Admitting: *Deleted

## 2021-02-27 DIAGNOSIS — N183 Chronic kidney disease, stage 3 unspecified: Secondary | ICD-10-CM | POA: Diagnosis not present

## 2021-02-27 DIAGNOSIS — U071 COVID-19: Secondary | ICD-10-CM | POA: Diagnosis not present

## 2021-02-27 DIAGNOSIS — I129 Hypertensive chronic kidney disease with stage 1 through stage 4 chronic kidney disease, or unspecified chronic kidney disease: Secondary | ICD-10-CM | POA: Diagnosis not present

## 2021-02-27 DIAGNOSIS — R059 Cough, unspecified: Secondary | ICD-10-CM | POA: Diagnosis present

## 2021-02-27 DIAGNOSIS — Z79899 Other long term (current) drug therapy: Secondary | ICD-10-CM | POA: Diagnosis not present

## 2021-02-27 LAB — RESP PANEL BY RT-PCR (FLU A&B, COVID) ARPGX2
Influenza A by PCR: NEGATIVE
Influenza B by PCR: NEGATIVE
SARS Coronavirus 2 by RT PCR: POSITIVE — AB

## 2021-02-27 MED ORDER — BENZONATATE 100 MG PO CAPS
100.0000 mg | ORAL_CAPSULE | Freq: Once | ORAL | Status: AC
  Administered 2021-02-27: 18:00:00 100 mg via ORAL
  Filled 2021-02-27: qty 1

## 2021-02-27 MED ORDER — MOLNUPIRAVIR EUA 200MG CAPSULE: 4.0000 | ORAL_CAPSULE | Freq: Two times a day (BID) | ORAL | 0 refills | Status: AC

## 2021-02-27 MED ORDER — BENZONATATE 100 MG PO CAPS: 100.0000 mg | ORAL_CAPSULE | Freq: Three times a day (TID) | ORAL | 0 refills | Status: AC

## 2021-02-27 MED ORDER — NIRMATRELVIR/RITONAVIR (PAXLOVID)TABLET: 3.0000 | ORAL_TABLET | Freq: Two times a day (BID) | ORAL | 0 refills | Status: AC

## 2021-02-27 NOTE — Discharge Instructions (Addendum)
Check MyChart for the results of your COVID test.  If you are positive start taking Paxil Oved twice daily for the next 5 days.  For fever, HA, sore throat or body aches - take tylenol ([phone] mg) every 8 hours. Do not exceed 3000mg /day. You ca also take ibuprofen.   For cough, take tessalon perles every 8 hours as needed. You can also use cough drops or drink honey.  Return if things worsen or change.

## 2021-02-27 NOTE — Telephone Encounter (Signed)
I called the patient at home to inform her that the dose was incorrect of the paxlovid based on her prior kidney function, alternative medication was called into the pharmacy, Molnupiravir she understands this and will pick it up her sick in the morning, she will not get the medication filled that she was given at the hospital today

## 2021-02-27 NOTE — ED Provider Notes (Signed)
[place] EMERGENCY DEPARTMENT Provider Note   CSN: [id number] Arrival date & time: 02/27/21  1637     History No chief complaint on file.   Chloe Norman is a 70 y.o. female.  HPI  Patient with history of OSA and hypertension presents due to cough x3 days.  Started acutely on Saturday, has been happening intermittently nonproductively since then.  She denies any other symptoms other than a headache.  She reports that she has been febrile at home up to "107.0 degrees".  She has been taking Tylenol and Motrin which have been alleviating her symptoms.  She is COVID vaccinated and boosted.  No nausea, vomiting, chest pain, shortness of breath.  Past Medical History:  Diagnosis Date   Hypertension    Sleep apnea     Patient Active Problem List   Diagnosis Date Noted   RENAL DISEASE, CHRONIC, STAGE III 09/28/2009   ABDOMINAL PAIN, GENERALIZED 09/28/2009   EXOGENOUS OBESITY 09/15/2009   ESSENTIAL HYPERTENSION 09/15/2009   OSTEOARTHRITIS, KNEE 09/15/2009   TROCHANTERIC BURSITIS, BILATERAL 09/15/2009    Past Surgical History:  Procedure Laterality Date   KNEE SURGERY       OB History   No obstetric history on file.     No family history on file.  Social History   Tobacco Use   Smoking status: Never  Vaping Use   Vaping Use: Never used  Substance Use Topics   Alcohol use: Never   Drug use: Never    Home Medications Prior to Admission medications   Medication Sig Start Date End Date Taking? Authorizing Provider  amLODipine (NORVASC) 5 MG tablet Take 5 mg by mouth daily.   Yes [provider]  amLODipine (NORVASC) 5 MG tablet Take 1 tablet by mouth daily. 08/05/15  Yes [provider]  carvedilol (COREG) 25 MG tablet Take 25 mg by mouth 2 (two) times daily with meals.     Yes [provider]  ergocalciferol (VITAMIN D2) 1.25 MG (50000 UT) capsule Take 1 capsule by mouth once a week. 09/17/16  Yes [provider]   gabapentin (NEURONTIN) 300 MG capsule gabapentin 300 mg capsule   Yes [provider]  hydrALAZINE (APRESOLINE) 25 MG tablet Take 25 mg by mouth 3 (three) times daily.   Yes [provider]  Multiple Vitamin (MULTI-VITAMIN) tablet Take 1 tablet by mouth daily. 11/13/18  Yes [provider]  pravastatin (PRAVACHOL) 40 MG tablet Take 1 tablet by mouth at bedtime. 01/05/21  Yes [provider]  doxycycline (VIBRAMYCIN) 100 MG capsule Take 1 capsule (100 mg total) by mouth 2 (two) times daily. One po bid x 7 days 04/27/15   Sherwood Gambler, MD  Oral Electrolytes (HYDRALIFE PO) Take by mouth.    [provider]    Allergies    Amlodipine besylate, Atorvastatin, Codeine sulfate, and Tramadol hcl  Review of Systems   Review of Systems  Constitutional:  Positive for fever.  Respiratory:  Positive for cough.    Physical Exam Updated Vital Signs BP (!) 166/80 (BP Location: Right Arm)    Pulse 69    Temp 98.9 F (37.2 C) (Oral)    Resp 17    Ht 5\' 5"  (1.651 m)    Wt 113.4 kg    SpO2 96%    BMI 41.60 kg/m   Physical Exam Vitals and nursing note reviewed. Exam conducted with a chaperone present.  Constitutional:      Appearance: Normal appearance.  HENT:  Head: Normocephalic.     Nose: Congestion present.     Mouth/Throat:     Pharynx: No posterior oropharyngeal erythema.  Eyes:     Extraocular Movements: Extraocular movements intact.     Pupils: Pupils are equal, round, and reactive to light.  Cardiovascular:     Rate and Rhythm: Normal rate and regular rhythm.  Pulmonary:     Effort: Pulmonary effort is normal.     Breath sounds: Normal breath sounds.     Comments: Lungs CTA bilaterally. No accessory muscle use. Speaking in complete sentences.  Abdominal:     General: Abdomen is flat.     Palpations: Abdomen is soft.  Musculoskeletal:     Cervical back: Normal range of motion.  Neurological:     Mental Status: She is alert.   Psychiatric:        Mood and Affect: Mood normal.    ED Results / Procedures / Treatments   Labs (all labs ordered are listed, but only abnormal results are displayed) Labs Reviewed - No data to display  EKG None  Radiology No results found.  Procedures Procedures   Medications Ordered in ED Medications - No data to display  ED Course  I have reviewed the triage vital signs and the nursing notes.  Pertinent labs & imaging results that were available during my care of the patient were reviewed by me and considered in my medical decision making (see chart for details).    MDM Rules/Calculators/A&P                         This is a 70 year old female presenting with COVID-positive diagnosis and fever x3 days.  She is not febrile on exam, she has no tachypnea or tachycardia.  Her symptoms been going on for less than 5 days, per chart review her last GFR was 38.  Given this we will give renal dosing.  The Paxil Oved as patient desires it.  She is not tachypneic, tachycardic, hypoxic.  Lungs are clear to auscultation bilaterally, do not feel at this time she needs additional work-up.     Final Clinical Impression(s) / ED Diagnoses Final diagnoses:  None    Rx / DC Orders ED Discharge Orders     None        Sherrill Raring, Hershal Coria 02/27/21 1949    Godfrey Pick, MD 03/01/21 [phone]

## 2021-02-27 NOTE — ED Triage Notes (Signed)
Covid + yesterday. She has a fever.

## 2022-03-21 ENCOUNTER — Other Ambulatory Visit (HOSPITAL_COMMUNITY)
Admission: RE | Admit: 2022-03-21 | Discharge: 2022-03-21 | Disposition: A | Discharge status: Home / Self Care | Payer: Medicare Other | Source: Ambulatory Visit | Attending: Optometry | Admitting: Optometry

## 2022-03-21 DIAGNOSIS — D47Z9 Other specified neoplasms of uncertain behavior of lymphoid, hematopoietic and related tissue: Secondary | ICD-10-CM | POA: Diagnosis not present

## 2022-03-21 DIAGNOSIS — H4489 Other disorders of globe: Secondary | ICD-10-CM | POA: Diagnosis present

## 2022-03-23 LAB — SURGICAL PATHOLOGY
